# Patient Record
Sex: Male | Born: 1995 | Race: White | Hispanic: No | Marital: Single | Smoking: Current every day smoker
Health system: Southern US, Community
[De-identification: ages and names within clinical notes are randomized; demographics above are authoritative.]

## PROBLEM LIST (undated history)

## (undated) DIAGNOSIS — F419 Anxiety disorder, unspecified: Secondary | ICD-10-CM

## (undated) DIAGNOSIS — F319 Bipolar disorder, unspecified: Secondary | ICD-10-CM

## (undated) HISTORY — DX: Bipolar disorder, unspecified: F31.9

## (undated) HISTORY — DX: Anxiety disorder, unspecified: F41.9

---
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
## 2014-01-22 ENCOUNTER — Ambulatory Visit: Payer: Self-pay | Admitting: Orthopedic Surgery

## 2014-08-31 ENCOUNTER — Emergency Department: Payer: Self-pay | Admitting: Emergency Medicine

## 2014-08-31 LAB — COMPREHENSIVE METABOLIC PANEL
ALBUMIN: 4.2 g/dL (ref 3.8–5.6)
ALK PHOS: 71 U/L
ALT: 31 U/L
ANION GAP: 4 — AB (ref 7–16)
BILIRUBIN TOTAL: 0.6 mg/dL (ref 0.2–1.0)
BUN: 11 mg/dL (ref 7–18)
CALCIUM: 9.4 mg/dL (ref 9.0–10.7)
CHLORIDE: 105 mmol/L (ref 98–107)
CO2: 30 mmol/L (ref 21–32)
Creatinine: 0.88 mg/dL (ref 0.60–1.30)
EGFR (Non-African Amer.): >60
Glucose: 95 mg/dL (ref 65–99)
Osmolality: 277 (ref 275–301)
Potassium: 4.4 mmol/L (ref 3.5–5.1)
SGOT(AST): 14 U/L (ref 10–41)
SODIUM: 139 mmol/L (ref 136–145)
Total Protein: 7.3 g/dL (ref 6.4–8.6)

## 2014-08-31 LAB — URINALYSIS, COMPLETE
BACTERIA: NONE SEEN
BLOOD: NEGATIVE
Bilirubin,UR: NEGATIVE
GLUCOSE, UR: NEGATIVE mg/dL (ref 0–75)
Ketone: NEGATIVE
LEUKOCYTE ESTERASE: NEGATIVE
NITRITE: NEGATIVE
Ph: 6 (ref 4.5–8.0)
Protein: NEGATIVE
RBC,UR: <1 /HPF (ref 0–5)
Specific Gravity: 1.019 (ref 1.003–1.030)
Squamous Epithelial: <1
WBC UR: 1 /HPF (ref 0–5)

## 2014-08-31 LAB — CBC WITH DIFFERENTIAL/PLATELET
BASOS ABS: 0 10*3/uL (ref 0.0–0.1)
BASOS PCT: 0.4 %
EOS PCT: 9 %
Eosinophil #: 0.6 10*3/uL (ref 0.0–0.7)
HCT: 48 % (ref 40.0–52.0)
HGB: 16 g/dL (ref 13.0–18.0)
LYMPHS ABS: 1.7 10*3/uL (ref 1.0–3.6)
LYMPHS PCT: 25.8 %
MCH: 29.5 pg (ref 26.0–34.0)
MCHC: 33.4 g/dL (ref 32.0–36.0)
MCV: 88 fL (ref 80–100)
MONO ABS: 0.4 x10 3/mm (ref 0.2–1.0)
Monocyte %: 5.7 %
Neutrophil #: 3.8 10*3/uL (ref 1.4–6.5)
Neutrophil %: 59.1 %
Platelet: 177 10*3/uL (ref 150–440)
RBC: 5.43 10*6/uL (ref 4.40–5.90)
RDW: 13.6 % (ref 11.5–14.5)
WBC: 6.5 10*3/uL (ref 3.8–10.6)

## 2014-08-31 LAB — LIPASE, BLOOD: LIPASE: 108 U/L (ref 73–393)

## 2014-09-29 LAB — BASIC METABOLIC PANEL
BUN: 10 mg/dL (ref 4–21)
CREATININE: 0.8 mg/dL (ref 0.6–1.3)
Glucose: 79 mg/dL
POTASSIUM: 4.2 mmol/L (ref 3.4–5.3)
Sodium: 142 mmol/L (ref 137–147)

## 2014-09-29 LAB — CBC AND DIFFERENTIAL
HCT: 46 % (ref 41–53)
HEMOGLOBIN: 15.8 g/dL (ref 13.5–17.5)
NEUTROS ABS: 59 /uL
PLATELETS: 238 10*3/uL (ref 150–399)
WBC: 8 10^3/mL

## 2014-09-29 LAB — TSH: TSH: 0.7 u[IU]/mL (ref 0.41–5.90)

## 2014-09-29 LAB — HEPATIC FUNCTION PANEL
ALT: 12 U/L (ref 10–40)
AST: 13 U/L — AB (ref 14–40)
Alkaline Phosphatase: 65 U/L (ref 25–125)
Bilirubin, Total: 0.5 mg/dL

## 2015-03-22 ENCOUNTER — Encounter: Payer: Self-pay | Admitting: Family Medicine

## 2015-03-22 ENCOUNTER — Ambulatory Visit (INDEPENDENT_AMBULATORY_CARE_PROVIDER_SITE_OTHER): Payer: PRIVATE HEALTH INSURANCE | Admitting: Family Medicine

## 2015-03-22 VITALS — BP 110/72 | HR 74 | Temp 97.6°F | Resp 16 | Ht 72.5 in | Wt 165.8 lb

## 2015-03-22 DIAGNOSIS — F410 Panic disorder [episodic paroxysmal anxiety] without agoraphobia: Secondary | ICD-10-CM | POA: Diagnosis not present

## 2015-03-22 DIAGNOSIS — R634 Abnormal weight loss: Secondary | ICD-10-CM

## 2015-03-22 DIAGNOSIS — R197 Diarrhea, unspecified: Secondary | ICD-10-CM

## 2015-03-22 DIAGNOSIS — F5089 Other specified eating disorder: Secondary | ICD-10-CM

## 2015-03-22 DIAGNOSIS — F508 Other eating disorders: Secondary | ICD-10-CM | POA: Diagnosis not present

## 2015-03-22 DIAGNOSIS — F329 Major depressive disorder, single episode, unspecified: Secondary | ICD-10-CM | POA: Insufficient documentation

## 2015-03-22 DIAGNOSIS — F419 Anxiety disorder, unspecified: Secondary | ICD-10-CM | POA: Insufficient documentation

## 2015-03-22 DIAGNOSIS — G4700 Insomnia, unspecified: Secondary | ICD-10-CM | POA: Insufficient documentation

## 2015-03-22 DIAGNOSIS — F3162 Bipolar disorder, current episode mixed, moderate: Secondary | ICD-10-CM | POA: Diagnosis not present

## 2015-03-22 DIAGNOSIS — F32A Depression, unspecified: Secondary | ICD-10-CM | POA: Insufficient documentation

## 2015-03-22 DIAGNOSIS — F3181 Bipolar II disorder: Secondary | ICD-10-CM | POA: Insufficient documentation

## 2015-03-22 MED ORDER — LORAZEPAM 0.5 MG PO TABS: 0.5000 mg | ORAL_TABLET | Freq: Three times a day (TID) | ORAL | Status: DC

## 2015-03-22 MED ORDER — ONDANSETRON HCL 8 MG PO TABS: 8.0000 mg | ORAL_TABLET | Freq: Three times a day (TID) | ORAL | Status: DC | PRN

## 2015-03-22 NOTE — Progress Notes (Signed)
Subjective:    Patient ID: Lucas Cross, male    DOB: [birth year], 19 y.o.   MRN: [id number]  HPI Anxiety progressing recently (panic/anxiety with physical manifestations of nausea, vomiting, stomach cramps, diarrhea, not eating well and losing sleep). Sleeping 4-5 hours a night and girlfriend has notice extra irritability and angry outbursts as in the past). When anxiety flares, he will use marijuana to try to control symptoms.  Has a history of Bipolar disorder with paranoia treated by Dr. Maryruth Bun in the past. Has been off medication recently. Last OV on 09-29-14, he was treated by Dr. Sullivan Lone with Lorazepam, Abilify and Zofran. Requesting a refill of medications for panic and nausea.  History  Substance Use Topics  . Smoking status: Former Games developer  . Smokeless tobacco: Not on file  . Alcohol Use: No    History reviewed. No pertinent past medical history. Past Surgical History  Procedure Laterality Date  . Achilles tendon repair  2012   Family History  Problem Relation Age of Onset  . Alcohol abuse Mother   . Depression Mother   . Bipolar disorder Mother   . Depression Sister   . Bipolar disorder Sister    No current outpatient prescriptions on file prior to visit.   No current facility-administered medications on file prior to visit.   Allergies  Allergen Reactions  . Ibuprofen Nausea Only    Review of Systems  Constitutional: Positive for appetite change, fatigue and unexpected weight change.  Eyes: Negative.   Respiratory: Positive for chest tightness and shortness of breath.   Cardiovascular: Positive for palpitations. Negative for chest pain.  Gastrointestinal: Positive for nausea, vomiting, abdominal pain and diarrhea.  Endocrine: Positive for cold intolerance, heat intolerance and polydipsia.  Genitourinary: Negative.   Musculoskeletal:       Back and knuckles achy from past boxing for 5 years (age [age]).  Skin: Negative.   Neurological: Positive for  light-headedness and headaches. Negative for dizziness, tremors, seizures and syncope.  Psychiatric/Behavioral: Positive for agitation. The patient is nervous/anxious.        History of Bipolar Disorder. Having panic, anger and irritability with anorexia and weight loss. Getting daily marijuana (1-2x/day). Denies other illicit drug use or suicidal ideation.       Objective:   Physical Exam  Constitutional: He is oriented to person, place, and time. Vital signs are normal. He has a sickly appearance.  HENT:  Head: Normocephalic and atraumatic.  Right Ear: External ear normal.  Left Ear: External ear normal.  Nose: Nose normal.  Mouth/Throat: Oropharynx is clear and moist.  Eyes: Conjunctivae and EOM are normal. Pupils are equal, round, and reactive to light.  Neck: Normal range of motion. Neck supple.  Cardiovascular: Normal rate, regular rhythm and normal heart sounds.   Pulmonary/Chest: Effort normal and breath sounds normal.  Abdominal:  Generalized tenderness. BS wnl. No masses.  Musculoskeletal: Normal range of motion.  Neurological: He is oriented to person, place, and time.  Skin: Skin is warm. There is pallor.  Psychiatric: His speech is normal. His mood appears anxious. He is slowed. Thought content is not paranoid. He expresses no suicidal ideation. He expresses no suicidal plans. He is attentive.   BP 110/72 mmHg  Pulse 74  Temp(Src) 97.6 F (36.4 C) (Oral)  Resp 16  Ht 6' 0.5" (1.842 m)  Wt 165 lb 12.8 oz (75.206 kg)  BMI 22.17 kg/m2         Assessment & Plan:   1. Panic  anxiety syndrome Flare of panic and anxiety recently. Will get drug screen and blood tests to rule out illicit drug use and metabolic imbalances from poor eating habits. Will give Lorazepam to help control panic until lab reports are available and getting appointment with psychiatrist. - TSH - POCT Urine Drug Screen - LORazepam (ATIVAN) 0.5 MG tablet; Take 1 tablet (0.5 mg total) by mouth  every 8 (eight) hours.  Dispense: 30 tablet; Refill: 0  2. Diarrhea Recent onset with panic spikes. No recent travel out of the country. No bloody diarrhea and seems to be better controlled today. Will get routine labs and may use Imodium or Pepto-Bismol prn. - CBC with Differential - COMPLETE METABOLIC PANEL WITH GFR - HgB X5MA1c - POCT Urinalysis Dipstick  3. Weight loss Secondary to poor eating habits and GI upset from panic and Bipolar disorder. Encouraged to eat 3 balanced meals a day. Will get routine labs to rule out diabetes, thyroid disease, anemia or dehydration. - CBC with Differential - COMPLETE METABOLIC PANEL WITH GFR - TSH - HgB A1c - POCT Urinalysis Dipstick  4. Bipolar disorder, current episode mixed, moderate Been off medications and not had follow up with Dr. Maryruth BunKapur in several weeks (personal conflict with Dr. Maryruth BunKapur and unsure he could return with confidence). Pending lab reports, will schedule second opinion with another psychiatrist - denies suicidal ideation and agrees with plan. - POCT Urine Drug Screen  5. Psychogenic vomiting with nausea Secondary to panic/anxiety attacks. Will refill Zofran. States it helped a great deal in the past. Recheck routine labs. - ondansetron (ZOFRAN) 8 MG tablet; Take 1 tablet (8 mg total) by mouth every 8 (eight) hours as needed for nausea or vomiting.  Dispense: 20 tablet; Refill: 0

## 2015-04-21 ENCOUNTER — Encounter: Payer: Self-pay | Admitting: Family Medicine

## 2015-04-21 ENCOUNTER — Other Ambulatory Visit: Payer: Self-pay | Admitting: Family Medicine

## 2015-04-21 ENCOUNTER — Ambulatory Visit (INDEPENDENT_AMBULATORY_CARE_PROVIDER_SITE_OTHER): Payer: PRIVATE HEALTH INSURANCE | Admitting: Family Medicine

## 2015-04-21 VITALS — BP 104/66 | HR 68 | Temp 97.7°F | Resp 16 | Wt 166.2 lb

## 2015-04-21 DIAGNOSIS — R042 Hemoptysis: Secondary | ICD-10-CM

## 2015-04-21 DIAGNOSIS — F508 Other eating disorders: Secondary | ICD-10-CM

## 2015-04-21 DIAGNOSIS — F410 Panic disorder [episodic paroxysmal anxiety] without agoraphobia: Secondary | ICD-10-CM | POA: Diagnosis not present

## 2015-04-21 DIAGNOSIS — R1013 Epigastric pain: Secondary | ICD-10-CM | POA: Diagnosis not present

## 2015-04-21 DIAGNOSIS — R110 Nausea: Secondary | ICD-10-CM

## 2015-04-21 DIAGNOSIS — F5089 Other specified eating disorder: Secondary | ICD-10-CM

## 2015-04-21 LAB — POCT URINALYSIS DIPSTICK
Bilirubin, UA: NEGATIVE
Blood, UA: NEGATIVE
Glucose, UA: NEGATIVE
KETONES UA: NEGATIVE
Leukocytes, UA: NEGATIVE
NITRITE UA: NEGATIVE
PH UA: 7
Protein, UA: NEGATIVE
Spec Grav, UA: 1.015
UROBILINOGEN UA: 0.2

## 2015-04-21 MED ORDER — ONDANSETRON HCL 8 MG PO TABS: 8.0000 mg | ORAL_TABLET | Freq: Three times a day (TID) | ORAL | Status: DC | PRN

## 2015-04-21 MED ORDER — LORAZEPAM 0.5 MG PO TABS: 0.5000 mg | ORAL_TABLET | Freq: Three times a day (TID) | ORAL | Status: DC

## 2015-04-21 NOTE — Progress Notes (Signed)
Subjective:    Patient ID: Lucas Cross, male    DOB: November 11, 1995, 19 y.o.   MRN: [id number] Chief Complaint  Patient presents with  . Referral    to GI for stomach issues  . Medication Refill    Ativan and Zofran    HPI This 19 year old male anxiety fairly controlled with use of Ativan (don't like to take it - take it 2-3 times a week) and Zofran (once or twice a day with antacid twice a day) for nausea. States significant anxiety occurs twice a day and is helped by smoking marijuana. Feels anxiety occurring more in social settings and in crowds. Went to the beach last week, had 2 beers and "coughed up bright red blood" the next morning a couple times. Had some heartburn, nausea and stomach cramps. Still had marijuana 3-4 times a week and feels it helps settle stomach upset (nausea, diarrhea, stomach cramps).  Patient Active Problem List   Diagnosis Date Noted  . Anxiety 03/22/2015  . Bipolar 2 disorder, major depressive episode 03/22/2015  . Clinical depression 03/22/2015  . Cannot sleep 03/22/2015  . Weight loss 03/22/2015   History   Social History  . Marital Status: Single    Spouse Name: N/A  . Number of Children: N/A  . Years of Education: N/A   Occupational History  . Not on file.   Social History Main Topics  . Smoking status: Former Research scientist (life sciences)  . Smokeless tobacco: Not on file  . Alcohol Use: No  . Drug Use: Yes     Comment: uses marijuana once or twice a week  . Sexual Activity: Not on file   Other Topics Concern  . Not on file   Social History Narrative     Past Surgical History  Procedure Laterality Date  . Achilles tendon repair  2012   Family History  Problem Relation Age of Onset  . Alcohol abuse Mother   . Depression Mother   . Bipolar disorder Mother   . Depression Sister   . Bipolar disorder Sister    Current Outpatient Prescriptions on File Prior to Visit  Medication Sig Dispense Refill  . LORazepam (ATIVAN) 0.5 MG tablet Take 1  tablet (0.5 mg total) by mouth every 8 (eight) hours. 30 tablet 0  . ondansetron (ZOFRAN) 8 MG tablet Take 1 tablet (8 mg total) by mouth every 8 (eight) hours as needed for nausea or vomiting. 20 tablet 0   No current facility-administered medications on file prior to visit.   Allergies  Allergen Reactions  . Ibuprofen Nausea Only    Review of Systems  Constitutional: Positive for appetite change and unexpected weight change.       Ten pound weight loss since March 2016. Decreased appetite - one meal a day  HENT: Negative.   Eyes: Negative.   Respiratory: Positive for shortness of breath.        With anxiety/panic episodes.  Cardiovascular: Positive for palpitations.       With panic and anxiety.  Gastrointestinal: Positive for nausea, vomiting and diarrhea. Negative for blood in stool.       Recent heartburn  Genitourinary: Negative.   Musculoskeletal: Negative.   Hematological: Negative.   Psychiatric/Behavioral: Positive for agitation. Negative for suicidal ideas. The patient is nervous/anxious.        History of Bipolar Disorder with panic/anxiety.      BP 104/66 mmHg  Pulse 68  Temp(Src) 97.7 F (36.5 C) (Oral)  Resp 16  Wt 166 lb 3.2 oz (75.388 kg)  Objective:   Physical Exam  Constitutional: He appears well-developed and well-nourished.  HENT:  Head: Normocephalic and atraumatic.  Right Ear: External ear normal.  Left Ear: External ear normal.  Nose: Nose normal.  Mouth/Throat: Oropharynx is clear and moist.  Eyes: EOM are normal. Pupils are equal, round, and reactive to light.  Neck: Normal range of motion. Neck supple.  Cardiovascular: Normal rate, regular rhythm and normal heart sounds.   Pulmonary/Chest: Effort normal and breath sounds normal.  Abdominal: Soft. He exhibits no mass. There is tenderness. There is no guarding.  Generalized abdominal soreness.  Musculoskeletal: Normal range of motion.  Skin: Skin is warm and dry.  Psychiatric: Thought  content normal.  Some anxiousness with episodes of irritability.      Assessment & Plan:  1. Hemoptysis Onset last week. Denies nose bleeds and no significant coughing since his beach trip last week. States he has had some blood streaked sputum in the past with nasal congestion. Worried it may have been from reflux and dyspepsia this time. Will get labs to rule out significant blood loss. Given OC-Light kit to test stools for blood at home. - CBC with Differential/Platelet - Comprehensive metabolic panel  2. Dyspepsia Recent flare at the beach last week. Will check for H.pylori and may need GI referral if signs of blood loss or positive OC-Light. Should use Ranitidine 150 mg BID regualrly. - CBC with Differential/Platelet - H. pylori breath test  3. Nausea Chronic issue with some weight loss. Urinalysis normal today. Check CBC and CMP. May need GI referral. Suspect some relationship to Bipolar Disorder. - Comprehensive metabolic panel - POCT Urinalysis Dipstick  4. Panic anxiety syndrome Feels he is controlling the panic/anxiety with the Lorazepam and smoking marijuana. Recommend he stop smoking anything with spitting up blood and follow up with psychiatrist. Will refill Lorazepam today. - TSH - LORazepam (ATIVAN) 0.5 MG tablet; Take 1 tablet (0.5 mg total) by mouth every 8 (eight) hours.  Dispense: 30 tablet; Refill: 0  5. Psychogenic vomiting with nausea Did not go back to psychiatrist. Feels the Lorazepam and Ondansetron helps to control anxiety, irritability and nausea/vomiting symptoms. Encouraged to follow up with psychiatrist. - ondansetron (ZOFRAN) 8 MG tablet; Take 1 tablet (8 mg total) by mouth every 8 (eight) hours as needed for nausea or vomiting.  Dispense: 20 tablet; Refill: 0

## 2015-04-22 ENCOUNTER — Telehealth: Payer: Self-pay

## 2015-04-22 LAB — CBC WITH DIFFERENTIAL/PLATELET
BASOS ABS: 0 10*3/uL (ref 0.0–0.2)
Basos: 1 %
EOS (ABSOLUTE): 0.4 10*3/uL (ref 0.0–0.4)
EOS: 6 %
HEMATOCRIT: 43.2 % (ref 37.5–51.0)
HEMOGLOBIN: 14.8 g/dL (ref 12.6–17.7)
Immature Grans (Abs): 0 10*3/uL (ref 0.0–0.1)
Immature Granulocytes: 0 %
LYMPHS ABS: 2.4 10*3/uL (ref 0.7–3.1)
LYMPHS: 32 %
MCH: 29.4 pg (ref 26.6–33.0)
MCHC: 34.3 g/dL (ref 31.5–35.7)
MCV: 86 fL (ref 79–97)
MONOCYTES: 9 %
Monocytes Absolute: 0.6 10*3/uL (ref 0.1–0.9)
NEUTROS ABS: 4 10*3/uL (ref 1.4–7.0)
Neutrophils: 52 %
Platelets: 295 10*3/uL (ref 150–379)
RBC: 5.03 x10E6/uL (ref 4.14–5.80)
RDW: 14.1 % (ref 12.3–15.4)
WBC: 7.5 10*3/uL (ref 3.4–10.8)

## 2015-04-22 LAB — COMPREHENSIVE METABOLIC PANEL
A/G RATIO: 2 (ref 1.1–2.5)
ALT: 26 IU/L (ref 0–44)
AST: 22 IU/L (ref 0–40)
Albumin: 4.5 g/dL (ref 3.5–5.5)
Alkaline Phosphatase: 57 IU/L (ref 39–117)
BILIRUBIN TOTAL: 0.4 mg/dL (ref 0.0–1.2)
BUN/Creatinine Ratio: 29 — ABNORMAL HIGH (ref 8–19)
BUN: 18 mg/dL (ref 6–20)
CALCIUM: 10.3 mg/dL — AB (ref 8.7–10.2)
CO2: 22 mmol/L (ref 18–29)
CREATININE: 0.62 mg/dL — AB (ref 0.76–1.27)
Chloride: 102 mmol/L (ref 97–108)
GFR calc Af Amer: 166 mL/min/{1.73_m2} (ref >59)
GFR calc non Af Amer: 144 mL/min/{1.73_m2} (ref >59)
GLOBULIN, TOTAL: 2.3 g/dL (ref 1.5–4.5)
Glucose: 77 mg/dL (ref 65–99)
POTASSIUM: 5.1 mmol/L (ref 3.5–5.2)
SODIUM: 143 mmol/L (ref 134–144)
Total Protein: 6.8 g/dL (ref 6.0–8.5)

## 2015-04-22 LAB — H. PYLORI BREATH TEST: H. pylori UBiT: NEGATIVE

## 2015-04-22 LAB — TSH: TSH: 0.918 u[IU]/mL (ref 0.450–4.500)

## 2015-04-22 NOTE — Telephone Encounter (Signed)
Patient advised as directed below. Patient verbalized understanding and agrees with treatment plan. 

## 2015-04-22 NOTE — Telephone Encounter (Signed)
-----   Message from Tamsen Roersennis E Chrismon, [place] sent at 04/22/2015  3:33 PM EDT ----- So far, normal blood tests. Awaiting final urine and H.pylori test. Recommend he use Ranitidine 150 mg (OTC) BID to try to help stomach.

## 2015-04-23 LAB — C.O.C. TEST NOT ORDERED

## 2015-04-25 ENCOUNTER — Telehealth: Payer: Self-pay

## 2015-04-25 NOTE — Telephone Encounter (Signed)
-----   Message from Tamsen Roersennis E Chrismon, [place] sent at 04/22/2015  5:53 PM EDT ----- H.pylori breath test negative for the bacteria that causes stomach ulcers. Awaiting final lab results. Proceed with Ranitidine 150 mg BID for stomach symptoms.

## 2015-04-25 NOTE — Telephone Encounter (Signed)
Left a detailed message on patient's voicemail (consent in chart) advising him the test was normal, and to proceed with treatment plan as discussed on Friday.

## 2015-04-26 ENCOUNTER — Other Ambulatory Visit (INDEPENDENT_AMBULATORY_CARE_PROVIDER_SITE_OTHER): Payer: PRIVATE HEALTH INSURANCE | Admitting: Emergency Medicine

## 2015-04-26 DIAGNOSIS — R1013 Epigastric pain: Secondary | ICD-10-CM | POA: Diagnosis not present

## 2015-04-26 LAB — IFOBT (OCCULT BLOOD): IFOBT: NEGATIVE

## 2015-04-27 ENCOUNTER — Telehealth: Payer: Self-pay

## 2015-04-27 NOTE — Telephone Encounter (Signed)
-----   Message from Tamsen Roersennis E Chrismon, [place] sent at 04/26/2015  6:22 PM EDT ----- Stool test negative for blood in bowels. Proceed with acid medication and if no better in 5-6 days, will need referral to gastroenterologist.

## 2015-04-27 NOTE — Telephone Encounter (Signed)
Left patient a detailed message on cell voicemail advising him the test was negative, and to call back if no better in 5-6 days.

## 2015-04-30 LAB — DRUG-BUND PANEL (9)
AMPHETAMINE: NEGATIVE ng/mL
Barbiturates: NEGATIVE ng/mL
Benzodiazepines: NEGATIVE ng/mL
Cocaine (Metab.): NEGATIVE ng/mL
Methadone Screen, Urine: NEGATIVE ng/mL
Opiates: NEGATIVE ng/mL
PHENCYCLIDINE: NEGATIVE ng/mL
PROPOXYPHENE: NEGATIVE ng/mL

## 2015-04-30 LAB — PANEL 799049
CARBOXY THC GC/MS CONF: 435 ng/mL
Cannabinoid GC/MS, Ur: POSITIVE — AB

## 2015-05-06 ENCOUNTER — Ambulatory Visit: Payer: PRIVATE HEALTH INSURANCE | Admitting: Psychiatry

## 2015-06-30 ENCOUNTER — Encounter: Payer: Self-pay | Admitting: Family Medicine

## 2015-06-30 ENCOUNTER — Ambulatory Visit (INDEPENDENT_AMBULATORY_CARE_PROVIDER_SITE_OTHER): Payer: PRIVATE HEALTH INSURANCE | Admitting: Family Medicine

## 2015-06-30 VITALS — BP 98/82 | HR 92 | Temp 98.2°F | Resp 18 | Wt 174.2 lb

## 2015-06-30 DIAGNOSIS — F410 Panic disorder [episodic paroxysmal anxiety] without agoraphobia: Secondary | ICD-10-CM

## 2015-06-30 DIAGNOSIS — F508 Other eating disorders: Secondary | ICD-10-CM | POA: Diagnosis not present

## 2015-06-30 DIAGNOSIS — F3181 Bipolar II disorder: Secondary | ICD-10-CM

## 2015-06-30 DIAGNOSIS — F5089 Other specified eating disorder: Secondary | ICD-10-CM

## 2015-06-30 MED ORDER — LORAZEPAM 0.5 MG PO TABS: 0.5000 mg | ORAL_TABLET | Freq: Three times a day (TID) | ORAL | Status: DC

## 2015-06-30 MED ORDER — ONDANSETRON HCL 8 MG PO TABS: 8.0000 mg | ORAL_TABLET | Freq: Three times a day (TID) | ORAL | Status: DC | PRN

## 2015-06-30 NOTE — Progress Notes (Signed)
Patient ID: Lucas Cross, male   DOB: [birth year], 19 y.o.   MRN: [id number]   Chief Complaint  Patient presents with  . Medication Refill    Ativan 0.5 mg tablets  . Referral    to psychiatrist     Subjective:  HPI This 19 year old male with history of Bipolar disorder with panic/anxiety syndrome with physical manifestations of nausea and losing sleep. History of treatment by Dr. Maryruth Bun (psychiatrist) for Bipolar Disorder with Paranoia. Symptoms have been increasing anxiety and feeling overwhelmed since parents divorced 2 - 2 1/2 weeks ago. 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  Prior to Admission medications   Medication Sig Start Date End Date Taking? Authorizing Provider  LORazepam (ATIVAN) 0.5 MG tablet Take 1 tablet (0.5 mg total) by mouth every 8 (eight) hours. 04/21/15  Yes Dennis E Chrismon, PA  ondansetron (ZOFRAN) 8 MG tablet Take 1 tablet (8 mg total) by mouth every 8 (eight) hours as needed for nausea or vomiting. 04/21/15  Yes Tamsen Roers, PA    Patient Active Problem List   Diagnosis Date Noted  . Anxiety 03/22/2015  . Bipolar 2 disorder, major depressive episode 03/22/2015  . Clinical depression 03/22/2015  . Cannot sleep 03/22/2015  . Weight loss 03/22/2015   History reviewed. No pertinent past medical history.   Family History  Problem Relation Age of Onset  . Alcohol abuse Mother   . Depression Mother   . Bipolar disorder Mother   . Depression Sister   . Bipolar disorder Sister    Past Surgical History  Procedure Laterality Date  . Achilles tendon repair  2012   Social History   Social History  . Marital Status: Single    Spouse Name: N/A  . Number of Children: N/A  . Years of Education: N/A   Occupational History  . Not on file.   Social History Main Topics  . Smoking status: Former Games developer  . Smokeless tobacco: Not on file  . Alcohol Use: No  . Drug Use: Yes     Comment:  uses marijuana once or twice a week  . Sexual Activity: Not on file   Other Topics Concern  . Not on file   Social History Narrative   Allergies  Allergen Reactions  . Ibuprofen Nausea Only   Review of Systems  Constitutional: Negative.   HENT: Negative.   Eyes: Negative.   Respiratory: Negative.   Cardiovascular: Negative.   Gastrointestinal: Negative.   Genitourinary: Negative.   Musculoskeletal: Negative.   Skin: Negative.   Neurological: Negative.   Endo/Heme/Allergies: Negative.   Psychiatric/Behavioral: The patient is nervous/anxious.     There is no immunization history on file for this patient. Objective:  BP 98/82 mmHg  Pulse 92  Temp(Src) 98.2 F (36.8 C) (Oral)  Resp 18  Wt 174 lb 3.2 oz (79.017 kg)  SpO2 98% Wt Readings from Last 3 Encounters:  06/30/15 174 lb 3.2 oz (79.017 kg) (75 %*, Z = 0.66)  04/21/15 166 lb 3.2 oz (75.388 kg) (66 %*, Z = 0.42)  03/22/15 165 lb 12.8 oz (75.206 kg) (66 %*, Z = 0.41)   * Growth percentiles are based on CDC 2-20 Years data.   Physical Exam  Constitutional: He is oriented to person, place, and time and well-developed, well-nourished, and in no distress.  HENT:  Head: Normocephalic and atraumatic.  Mouth/Throat: Oropharynx is clear and moist.  Eyes: Conjunctivae and EOM are normal.  Neck: Neck supple.  Cardiovascular: Normal rate and regular rhythm.   Pulmonary/Chest: Effort normal and breath sounds normal.  Abdominal: Bowel sounds are normal.  Musculoskeletal: Normal range of motion.  Neurological: He is oriented to person, place, and time.  Skin: No rash noted.  Psychiatric: Memory and judgment normal. His mood appears anxious. He exhibits a depressed mood. He expresses no suicidal plans. He has a flat affect.  Some anxiety/panic and paranoia in the past.     Lab Results  Component Value Date   WBC 7.5 04/21/2015   HGB 15.8 09/29/2014   HCT 43.2 04/21/2015   PLT 238 09/29/2014   GLUCOSE 77 04/21/2015    TSH 0.918 04/21/2015    CMP     Component Value Date/Time   NA 143 04/21/2015 0958   NA 139 08/31/2014 1050   K 5.1 04/21/2015 0958   K 4.4 08/31/2014 1050   CL 102 04/21/2015 0958   CL 105 08/31/2014 1050   CO2 22 04/21/2015 0958   CO2 30 08/31/2014 1050   GLUCOSE 77 04/21/2015 0958   GLUCOSE 95 08/31/2014 1050   BUN 18 04/21/2015 0958   BUN 11 08/31/2014 1050   CREATININE 0.62* 04/21/2015 0958   CREATININE 0.8 09/29/2014   CREATININE 0.88 08/31/2014 1050   CALCIUM 10.3* 04/21/2015 0958   CALCIUM 9.4 08/31/2014 1050   PROT 6.8 04/21/2015 0958   PROT 7.3 08/31/2014 1050   ALBUMIN 4.2 08/31/2014 1050   AST 22 04/21/2015 0958   AST 14 08/31/2014 1050   ALT 26 04/21/2015 0958   ALT 31 08/31/2014 1050   ALKPHOS 57 04/21/2015 0958   ALKPHOS 71 08/31/2014 1050   BILITOT 0.4 04/21/2015 0958   BILITOT 0.6 08/31/2014 1050   GFRNONAA 144 04/21/2015 0958   GFRNONAA >60 08/31/2014 1050   GFRAA 166 04/21/2015 0958   GFRAA >60 08/31/2014 1050   Assessment and Plan :  1. Panic anxiety syndrome Ran out of Ativan (used 30 in the past 2 months) and having an increase in panic/paranoia since parents divorced 2-3 weeks ago. Requesting referral to psychiatry ("did not have a good relationship with Dr. Maryruth Bun"). Will schedule referral. - Ambulatory referral to Psychiatry - LORazepam (ATIVAN) 0.5 MG tablet; Take 1 tablet (0.5 mg total) by mouth every 8 (eight) hours.  Dispense: 30 tablet; Refill: 0  2. Bipolar 2 disorder, major depressive episode Not sleeping well and feeling down. No suicidal ideation. No longer taking the Abilify. Intermittent use of the Ativan for panic attacks. Still working 35 hours a week and going to school each morning. Request psychiatry referral. - Ambulatory referral to Psychiatry  3. Psychogenic vomiting with nausea Having nausea 2-3 times a week. Finds the Zofran controls this (used 30 in the past 2 months). Will refill and schedule psychiatry referral. -  Ambulatory referral to Psychiatry - ondansetron (ZOFRAN) 8 MG tablet; Take 1 tablet (8 mg total) by mouth every 8 (eight) hours as needed for nausea or vomiting.  Dispense: 20 tablet; Refill: 0  Dortha Kern [facility] 06/30/2015 10:58 AM

## 2015-07-08 ENCOUNTER — Encounter: Payer: Self-pay | Admitting: Family Medicine

## 2015-07-08 ENCOUNTER — Ambulatory Visit (INDEPENDENT_AMBULATORY_CARE_PROVIDER_SITE_OTHER): Payer: PRIVATE HEALTH INSURANCE | Admitting: Family Medicine

## 2015-07-08 VITALS — BP 112/60 | HR 76 | Temp 98.3°F | Resp 16 | Wt 177.0 lb

## 2015-07-08 DIAGNOSIS — J069 Acute upper respiratory infection, unspecified: Secondary | ICD-10-CM | POA: Diagnosis not present

## 2015-07-08 NOTE — Patient Instructions (Addendum)
Discussed use of Mucinex D for congestion, Delsym for cough, and Claritin for postnasal drainage. Call in the next few days if sinuses not improving.

## 2015-07-08 NOTE — Progress Notes (Signed)
Subjective:     Patient ID: Lucas Cross, male   DOB: 12/14/1995, 19 y.o.   MRN: [id number]  HPI  Chief Complaint  Patient presents with  . Cough    Patient comes in office today with concerns of cold like symptoms for the past 3+ days. Patient reports productive cough especially in the evening, congestion, headache, runny nose and sore throat. Patient states he has tried otc Day Quil with no relief  Does not like Nyquil as it gives him nightmares.Prefers not to use rx cough syrups.   Review of Systems  Constitutional: Negative for fever and chills.       No body aches  Psychiatric/Behavioral:       States he has psychiatric appointment pending 10/20.       Objective:   Physical Exam  Constitutional: He appears well-developed and well-nourished. No distress.  Ears: T.M's intact without inflammation Sinuses: non-tender Throat: no tonsillar enlargement or exudate Neck: no cervical adenopathy but has mild tenderness Lungs: clear     Assessment:    1. Upper respiratory infection    Plan:    Discussed use of Mucinex D for congestion, Delsym for cough, and Claritin for postnasal drainage

## 2015-08-02 ENCOUNTER — Other Ambulatory Visit: Payer: Self-pay | Admitting: Family Medicine

## 2015-08-02 NOTE — Telephone Encounter (Signed)
Patient advised as directed below. Patient verbalized understanding. Patient states he has a appointment with the psychiatrist on 08/04/2015.

## 2015-08-02 NOTE — Telephone Encounter (Signed)
Advise patient we need a report from his psychiatrist referral to refill medications.

## 2015-08-02 NOTE — Telephone Encounter (Signed)
Pt contacted office for refill request on the following medications: ondansetron (ZOFRAN) 8 MG tablet & LORazepam (ATIVAN) 0.5 MG tablet to [facility]. Thanks TNP

## 2015-08-04 ENCOUNTER — Ambulatory Visit (INDEPENDENT_AMBULATORY_CARE_PROVIDER_SITE_OTHER): Payer: PRIVATE HEALTH INSURANCE | Admitting: Psychiatry

## 2015-08-04 ENCOUNTER — Encounter: Payer: Self-pay | Admitting: Psychiatry

## 2015-08-04 VITALS — BP 108/60 | HR 91 | Temp 99.0°F | Ht 72.0 in | Wt 173.8 lb

## 2015-08-04 DIAGNOSIS — F3161 Bipolar disorder, current episode mixed, mild: Secondary | ICD-10-CM

## 2015-08-04 DIAGNOSIS — F4001 Agoraphobia with panic disorder: Secondary | ICD-10-CM

## 2015-08-04 MED ORDER — ESCITALOPRAM OXALATE 10 MG PO TABS: 10.0000 mg | ORAL_TABLET | Freq: Every day | ORAL | Status: DC

## 2015-08-04 MED ORDER — MIRTAZAPINE 7.5 MG PO TABS: 7.5000 mg | ORAL_TABLET | Freq: Every day | ORAL | Status: DC

## 2015-08-04 MED ORDER — HYDROXYZINE PAMOATE 25 MG PO CAPS: 25.0000 mg | ORAL_CAPSULE | Freq: Two times a day (BID) | ORAL | Status: DC | PRN

## 2015-08-04 NOTE — Progress Notes (Signed)
Psychiatric Initial Adult Assessment   Patient Identification: Lucas GinsbergChristopher A Cross MRN:  [id number] Date of Evaluation:  08/04/2015 Ref[facility] Source[facility] Chief Complaint:   Chief Complaint    Establish Care; Anxiety; Depression; Panic Attack; Insomnia     Visit Diagnosis:    ICD-9-CM ICD-10-CM   1. 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 Bipolar disorder, current episode mixed, mild (HCC) 296.61 F31.61    Diagnosis:   Patient Active Problem List   Diagnosis Date Noted  . Anxiety [F41.9] 03/22/2015  . Bipolar 2 disorder, major depressive episode (HCC) [F31.81] 03/22/2015  . Clinical depression [F32.9] 03/22/2015  . Cannot sleep [G47.00] 03/22/2015  . Weight loss [R63.4] 03/22/2015   History of Present Illness:    Pt is a 19 yo male [facility]. 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 He is was in [place] at that time. He reported that [place] in the middle of his senior year in high school as his father was in the [employer]. Patient stated that he saw Dr. Maryruth BunKapur  one time and she prescribed him  Abilify but it made him too sedated and he stopped the medications. He continues to have anxiety symptoms and was prescribed lorazepam and Zofran by his primary care physician. He takes lorazepam on a when necessary basis. He is unable to sleep due to his anxiety and he stated that he has been controlling his symptoms by using marijuana on a daily basis. He smokes twice daily 1 in the morning and another at night. He stated that he is poor appetite and has been losing weight. He will not eat for 1-2 days in a row. Patient reported that he wants to complete degree in  criminal justice and is studying at [employer]. He wants to start taking medications so he can stop the use of marijuana at this time.   He appeared motivated during the  interview. He reported that he does not sleep well as he comes back from work around 11:30 to 12 at midnight. He reported that his appetite is very poor and he is interested in taking medications are the same. He stated that his mother asked him to come with an open mind so he can be medication compliant. He currently denied having any suicidal ideations or plans. He denied having any perceptual disturbances. He reported that he has a good relationship with his girlfriend. She is also in [employer]. He works at [employer] at this time.   Elements:  Location:  panic attacks. Severity:  severe. Duration:  daily. Associated Signs/Symptoms: Depression Symptoms:  depressed mood, insomnia, fatigue, feelings of worthlessness/guilt, hopelessness, anxiety, panic attacks, loss of energy/fatigue, weight loss, decreased appetite, (Hypo) Manic Symptoms:  none Anxiety Symptoms:  Agoraphobia, Excessive Worry, Psychotic Symptoms:  none PTSD Symptoms: Negative NA  Past Medical History:  Past Medical History  Diagnosis Date  . Bipolar disorder (HCC)   . Anxiety     Past Surgical History  Procedure Laterality Date  . Achilles tendon repair  2012   Family History:  Family History  Problem Relation Age of Onset  . Alcohol abuse Mother   . Depression Mother   . Bipolar disorder Mother   . Hypertension Mother   . Depression Sister   . Bipolar disorder Sister   . Obesity Father    Social History:  Social History   Social History  . Marital Status: Single    Spouse Name: N/A  . Number of Children: N/A  . Years of Education: N/A   Social History Main Topics  . Smoking status: Current Every Day Smoker    Types: Cigarettes    Start date: 08/03/2013  . Smokeless tobacco: Never Used  . Alcohol Use: No  . Drug Use: Yes    Special: Marijuana     Comment: last used 08-03-15, use daily  . Sexual Activity: Yes    Birth Control/ Protection: Condom   Other Topics Concern  . None   Social  History Narrative   Additional Social History:  Lives with parents. Parents recently divorced. Has a sister.   Musculoskeletal: Strength & Muscle Tone: within normal limits Gait & Station: normal Patient leans: N/A  Psychiatric Specialty Exam: HPI  ROS  Blood pressure 108/60, pulse 91, temperature 99 F (37.2 C), temperature source Tympanic, height 6' (1.829 m), weight 173 lb 12.8 oz (78.835 kg), SpO2 98 %.Body mass index is 23.57 kg/(m^2).  General Appearance: Casual  Eye Contact:  Fair  Speech:  Clear and Coherent and Normal Rate  Volume:  Normal  Mood:  Anxious and Depressed  Affect:  Congruent  Thought Process:  Coherent and Goal Directed  Orientation:  Full (Time, Place, and Person)  Thought Content:  WDL  Suicidal Thoughts:  No  Homicidal Thoughts:  No  Memory:  Immediate;   Fair  Judgement:  Fair  Insight:  Fair  Psychomotor Activity:  Normal  Concentration:  Fair  Recall:  [employer] of Knowledge:Fair  Language: Fair  Akathisia:  No  Handed:  Right  AIMS (if indicated):    Assets:  Communication Skills Desire for Improvement Physical Health Social Support Transportation  ADL's:  Intact  Cognition: WNL  Sleep:     Is the patient at risk to self?  No. Has the patient been a risk to self in the past 6 months?  No. Has the patient been a risk to self within the distant past?  No. Is the patient a risk to others?  No. Has the patient been a risk to others in the past 6 months?  No. Has the patient been a risk to others within the distant past?  No.  Allergies:   Allergies  Allergen Reactions  . Ibuprofen Nausea Only   Current Medications: Current Outpatient Prescriptions  Medication Sig Dispense Refill  . LORazepam (ATIVAN) 0.5 MG tablet Take 1 tablet (0.5 mg total) by mouth every 8 (eight) hours. 30 tablet 0  . ondansetron (ZOFRAN) 8 MG tablet Take 1 tablet (8 mg total) by mouth every 8 (eight) hours as needed for nausea or vomiting. 20 tablet 0    No current facility-administered medications for this visit.    Previous Psychotropic Medications: Lorazepam  Abilify- Sleepy and tired.  No h/o suicide attempt.  No h/o psychiatric illness  FAMILY HISTORY Alcohol- Mother Bipolar- Mother, sister, grandmother OCD- sister, mother, grandmother.     Substance Abuse History in the last 12 months:  Yes.    Uses MJ x 2 years. Denied using alcohol or other illicit drugs.   Consequences of Substance Abuse: Negative NA  Medical Decision Making:  Review of Psycho-Social Stressors (1) and Established Problem, Worsening (2)  Treatment Plan Summary: Medication management   Anxiety Patient will be started on Lexapro 10 mg by mouth daily. He will also be prescribed Vistaril 25 mg by mouth twice  a day when necessary for his anxiety symptoms.  Depression He will also be prescribed Remeron 7.5 mg to help with his depressive symptoms as well as decreased appetite. He agreed with the plan.  Follow-up Patient will follow up in 1 month or earlier depending on his symptoms   More than 50% of the time spent in psychoeducation, counseling and coordination of care.   Time spent with the patient 1 hour     This note was generated in part or whole with voice recognition software. Voice regonition is usually quite accurate but there are transcription errors that can and very often do occur. I apologize for any typographical errors that were not detected and corrected.     Brandy Hale, M.D 10/20/20163:07 PM

## 2015-09-01 ENCOUNTER — Encounter: Payer: Self-pay | Admitting: Psychiatry

## 2015-09-01 ENCOUNTER — Ambulatory Visit (INDEPENDENT_AMBULATORY_CARE_PROVIDER_SITE_OTHER): Payer: PRIVATE HEALTH INSURANCE | Admitting: Psychiatry

## 2015-09-01 VITALS — BP 122/60 | HR 88 | Temp 98.2°F | Ht 72.0 in | Wt 181.2 lb

## 2015-09-01 DIAGNOSIS — F4001 Agoraphobia with panic disorder: Secondary | ICD-10-CM

## 2015-09-01 MED ORDER — MIRTAZAPINE 15 MG PO TABS: 15.0000 mg | ORAL_TABLET | Freq: Every day | ORAL | Status: DC

## 2015-09-01 MED ORDER — HYDROXYZINE PAMOATE 25 MG PO CAPS: 25.0000 mg | ORAL_CAPSULE | Freq: Two times a day (BID) | ORAL | Status: DC | PRN

## 2015-09-01 MED ORDER — ESCITALOPRAM OXALATE 20 MG PO TABS: 20.0000 mg | ORAL_TABLET | Freq: Every day | ORAL | Status: DC

## 2015-09-01 NOTE — Progress Notes (Signed)
Psychiatric MD/NP Follow up Note   Patient Identification: Lucas Cross MRN:  [id number] Date of Evaluation:  09/01/2015 [facility] Chief Complaint:   Chief Complaint    Follow-up; Medication Refill     Visit Diagnosis:    ICD-9-CM ICD-10-CM   1. Panic disorder with agoraphobia 300.21 F40.01    Diagnosis:   Patient Active Problem List   Diagnosis Date Noted  . Anxiety [F41.9] 03/22/2015  . Bipolar 2 disorder, major depressive episode (HCC) [F31.81] 03/22/2015  . Clinical depression [F32.9] 03/22/2015  . Cannot sleep [G47.00] 03/22/2015  . Weight loss [R63.4] 03/22/2015   History of Present Illness:    Pt is a 19 yo male  who presented for the follow-up. He reported that he continues to have anxiety and was shaking his leg during the interview. He reported that he has started taking his medications as prescribed. He reported that he has been taking Lexapro in the morning and it has been at bedtime. He sleeping well with the help of mirtazapine. He reported that he takes hydroxyzine on a when necessary basis for his anxiety symptoms. He reported that he has some worsening of his anxiety and his grandmother and girlfriend were able to help him and support him during the time. He stated that he is trying to exercise more and his trying to relax himself. He was focused on his anxiety symptoms and appeared calm during the interview. He brought his bottles of medications with him during the interview. He sleeping well with the help of Remeron.   Patient stated that he is cutting down on his use of marijuana as well as he is smoking less now. He stated that he feels that Remeron is helping with his appetite and he is eating better and is trying to eat healthy now.    Elements:  Location:  panic attacks. Severity:  severe. Duration:  daily. Associated Signs/Symptoms: Depression Symptoms:  depressed mood, insomnia, fatigue, feelings of  worthlessness/guilt, hopelessness, anxiety, panic attacks, loss of energy/fatigue, weight loss, decreased appetite, (Hypo) Manic Symptoms:  none Anxiety Symptoms:  Agoraphobia, Excessive Worry, Psychotic Symptoms:  none PTSD Symptoms: Negative NA  Past Medical History:  Past Medical History  Diagnosis Date  . Bipolar disorder (HCC)   . Anxiety     Past Surgical History  Procedure Laterality Date  . Achilles tendon repair  2012   Family History:  Family History  Problem Relation Age of Onset  . Alcohol abuse Mother   . Depression Mother   . Bipolar disorder Mother   . Hypertension Mother   . Depression Sister   . Bipolar disorder Sister   . Obesity Father    Social History:   Social History   Social History  . Marital Status: Single    Spouse Name: N/A  . Number of Children: N/A  . Years of Education: N/A   Social History Main Topics  . Smoking status: Current Every Day Smoker    Types: E-cigarettes    Start date: 08/03/2013  . Smokeless tobacco: Never Used  . Alcohol Use: No  . Drug Use: Yes    Special: Marijuana     Comment: last used 08-30-15  . Sexual Activity: Yes    Birth Control/ Protection: Condom   Other Topics Concern  . None   Social History Narrative   Additional Social History:  Lives with parents. Parents recently divorced. Has a sister.   Musculoskeletal: Strength & Muscle Tone: within normal limits Gait &  Station: normal Patient leans: N/A  Psychiatric Specialty Exam: HPI   ROS   Blood pressure 122/60, pulse 88, temperature 98.2 F (36.8 C), temperature source Tympanic, height 6' (1.829 m), weight 181 lb 3.2 oz (82.192 kg), SpO2 96 %.Body mass index is 24.57 kg/(m^2).  General Appearance: Casual  Eye Contact:  Fair  Speech:  Clear and Coherent and Normal Rate  Volume:  Normal  Mood:  Anxious and Depressed  Affect:  Congruent  Thought Process:  Coherent and Goal Directed  Orientation:  Full (Time, Place, and Person)   Thought Content:  WDL  Suicidal Thoughts:  No  Homicidal Thoughts:  No  Memory:  Immediate;   Fair  Judgement:  Fair  Insight:  Fair  Psychomotor Activity:  Normal  Concentration:  Fair  Recall:  [employer] of Knowledge:Fair  Language: Fair  Akathisia:  No  Handed:  Right  AIMS (if indicated):    Assets:  Communication Skills Desire for Improvement Physical Health Social Support Transportation  ADL's:  Intact  Cognition: WNL  Sleep:     Is the patient at risk to self?  No. Has the patient been a risk to self in the past 6 months?  No. Has the patient been a risk to self within the distant past?  No. Is the patient a risk to others?  No. Has the patient been a risk to others in the past 6 months?  No. Has the patient been a risk to others within the distant past?  No.  Allergies:   Allergies  Allergen Reactions  . Ibuprofen Nausea Only   Current Medications: Current Outpatient Prescriptions  Medication Sig Dispense Refill  . escitalopram (LEXAPRO) 20 MG tablet Take 1 tablet (20 mg total) by mouth daily. 30 tablet 1  . hydrOXYzine (VISTARIL) 25 MG capsule Take 1 capsule (25 mg total) by mouth 2 (two) times daily as needed. 60 capsule 1  . mirtazapine (REMERON) 15 MG tablet Take 1 tablet (15 mg total) by mouth at bedtime. 30 tablet 1   No current facility-administered medications for this visit.    Previous Psychotropic Medications: Lorazepam  Abilify- Sleepy and tired.  No h/o suicide attempt.  No h/o psychiatric illness  FAMILY HISTORY Alcohol- Mother Bipolar- Mother, sister, grandmother OCD- sister, mother, grandmother.     Substance Abuse History in the last 12 months:  Yes.    Uses MJ x 2 years. Denied using alcohol or other illicit drugs.   Consequences of Substance Abuse: Negative NA  Medical Decision Making:  Review of Psycho-Social Stressors (1) and Established Problem, Worsening (2)  Treatment Plan Summary: Medication management    Anxiety Patient will be started on Lexapro 20 mg by mouth daily. He will also be prescribed Vistaril 25 mg by mouth twice a day when necessary for his anxiety symptoms.  Depression He will also be prescribed Remeron 15 mg to help with his depressive symptoms as well as decreased appetite. He agreed with the plan.  Follow-up Patient will follow up in 2 month or earlier depending on his symptoms   More than 50% of the time spent in psychoeducation, counseling and coordination of care.   Time spent with the patient 25 mins     This note was generated in part or whole with voice recognition software. Voice regonition is usually quite accurate but there are transcription errors that can and very often do occur. I apologize for any typographical errors that were not detected and corrected.  Brandy Hale, M.D 11/17/20163:50 PM

## 2015-11-01 ENCOUNTER — Ambulatory Visit: Payer: PRIVATE HEALTH INSURANCE | Admitting: Psychiatry

## 2015-11-08 ENCOUNTER — Encounter: Payer: Self-pay | Admitting: Psychiatry

## 2015-11-08 ENCOUNTER — Ambulatory Visit (INDEPENDENT_AMBULATORY_CARE_PROVIDER_SITE_OTHER): Payer: PRIVATE HEALTH INSURANCE | Admitting: Psychiatry

## 2015-11-08 VITALS — BP 110/68 | HR 95 | Temp 98.1°F | Ht 72.5 in | Wt 177.6 lb

## 2015-11-08 DIAGNOSIS — F4001 Agoraphobia with panic disorder: Secondary | ICD-10-CM

## 2015-11-08 MED ORDER — QUETIAPINE FUMARATE 25 MG PO TABS: 25.0000 mg | ORAL_TABLET | Freq: Every day | ORAL | Status: DC

## 2015-11-08 NOTE — Progress Notes (Signed)
Psychiatric MD/NP Follow up Note   Patient Identification: Lucas Cross MRN:  [id number] Date of Evaluation:  11/08/2015 Referral Source: [facility] Chief Complaint:   Chief Complaint    Follow-up; Medication Refill; Panic Attack; Anxiety     Visit Diagnosis:    ICD-9-CM ICD-10-CM   1. 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 Anxiety [F41.9] 03/22/2015  . Bipolar 2 disorder, major depressive episode (HCC) [F31.81] 03/22/2015  . Clinical depression [F32.9] 03/22/2015  . Cannot sleep [G47.00] 03/22/2015  . Weight loss [R63.4] 03/22/2015   History of Present Illness:    Pt is a 20 yo male  who presented for the follow-up. He reported that he he is doing well in school and has been taking his classes regularly. Patient reported that he has been focused in his schoolwork and will be transferring to [employer]. He reported that he is doing his major in criminal justice. Patient reported that he has stopped all his medications approximately 3 weeks ago as he was trying to control his symptoms without the medications. He has become more religious and has been going to the church and was very calm and relaxed. He will attend church every Wednesday and Sunday. She stated that he is only having problem going to sleep at night and needs medication for the same. He appeared very calm and collective. He reported that he has already stopped using all the drugs including marijuana at this time. He appeared less anxious and was coherent during the interview. He denied having any suicidal homicidal ideations or plans.     Past Medical History:  Past Medical History  Diagnosis Date  . Bipolar disorder (HCC)   . Anxiety     Past Surgical History  Procedure Laterality Date  . Achilles tendon repair  2012   Family History:  Family History  Problem Relation Age of Onset   . Alcohol abuse Mother   . Depression Mother   . Bipolar disorder Mother   . Hypertension Mother   . Depression Sister   . Bipolar disorder Sister   . Obesity Father    Social History:   Social History   Social History  . Marital Status: Single    Spouse Name: N/A  . Number of Children: N/A  . Years of Education: N/A   Social History Main Topics  . Smoking status: Current Every Day Smoker    Types: E-cigarettes    Start date: 08/03/2013  . Smokeless tobacco: Never Used  . Alcohol Use: No  . Drug Use: Yes    Special: Marijuana     Comment: last used 08-30-15  . Sexual Activity: Yes    Birth Control/ Protection: Condom   Other Topics Concern  . None   Social History Narrative   Additional Social History:  Lives with parents. Parents recently divorced. Has a sister.   Musculoskeletal: Strength & Muscle Tone: within normal limits Gait & Station: normal Patient leans: N/A  Psychiatric Specialty Exam: Anxiety      ROS   Blood pressure 110/68, pulse 95, temperature 98.1 F (36.7 C), temperature source Tympanic, height 6' 0.5" (1.842 m), weight 177 lb 9.6 oz (80.559 kg), SpO2 96 %.Body mass index is 23.74 kg/(m^2).  General Appearance: Casual  Eye Contact:  Fair  Speech:  Clear and Coherent and Normal Rate  Volume:  Normal  Mood:  Euthymic  Affect:  Congruent  Thought Process:  Coherent and Goal Directed  Orientation:  Full (Time, Place, and Person)  Thought Content:  WDL  Suicidal Thoughts:  No  Homicidal Thoughts:  No  Memory:  Immediate;   Fair  Judgement:  Fair  Insight:  Fair  Psychomotor Activity:  Normal  Concentration:  Fair  Recall:  [employer] of Knowledge:Fair  Language: Fair  Akathisia:  No  Handed:  Right  AIMS (if indicated):    Assets:  Communication Skills Desire for Improvement Physical Health Social Support Transportation  ADL's:  Intact  Cognition: WNL  Sleep:     Is the patient at risk to self?  No. Has the patient been  a risk to self in the past 6 months?  No. Has the patient been a risk to self within the distant past?  No. Is the patient a risk to others?  No. Has the patient been a risk to others in the past 6 months?  No. Has the patient been a risk to others within the distant past?  No.  Allergies:   Allergies  Allergen Reactions  . Ibuprofen Nausea Only   Current Medications: Current Outpatient Prescriptions  Medication Sig Dispense Refill  . Pseudoephedrine-APAP-DM (TYLENOL COLD/FLU SEVERE DAY PO) Take by mouth.    . escitalopram (LEXAPRO) 20 MG tablet Take 1 tablet (20 mg total) by mouth daily. (Patient not taking: Reported on 11/08/2015) 30 tablet 1  . hydrOXYzine (VISTARIL) 25 MG capsule Take 1 capsule (25 mg total) by mouth 2 (two) times daily as needed. (Patient not taking: Reported on 11/08/2015) 60 capsule 1  . mirtazapine (REMERON) 15 MG tablet Take 1 tablet (15 mg total) by mouth at bedtime. (Patient not taking: Reported on 11/08/2015) 30 tablet 1   No current facility-administered medications for this visit.    Previous Psychotropic Medications: Lorazepam  Abilify- Sleepy and tired.  No h/o suicide attempt.  No h/o psychiatric illness  FAMILY HISTORY Alcohol- Mother Bipolar- Mother, sister, grandmother OCD- sister, mother, grandmother.   Substance Abuse History in the last 12 months:  Yes.    Uses MJ x 2 years. Denied using alcohol or other illicit drugs.   Consequences of Substance Abuse: Negative NA  Medical Decision Making:  Review of Psycho-Social Stressors (1) and Established Problem, Worsening (2)  Treatment Plan Summary: Medication management   I will start him on Seroquel 25 mg by mouth daily at bedtime to help with anxiety and insomnia..  Follow-up Patient will follow up in 2 month or earlier depending on his symptoms   More than 50% of the time spent in psychoeducation, counseling and coordination of care.   Time spent with the patient 25  mins     This note was generated in part or whole with voice recognition software. Voice regonition is usually quite accurate but there are transcription errors that can and very often do occur. I apologize for any typographical errors that were not detected and corrected.     Brandy Hale, M.D 1/24/20173:53 PM

## 2015-11-10 ENCOUNTER — Ambulatory Visit: Payer: PRIVATE HEALTH INSURANCE | Admitting: Psychiatry

## 2015-12-26 ENCOUNTER — Ambulatory Visit (INDEPENDENT_AMBULATORY_CARE_PROVIDER_SITE_OTHER): Payer: 59 | Admitting: Psychiatry

## 2015-12-26 ENCOUNTER — Encounter: Payer: Self-pay | Admitting: Psychiatry

## 2015-12-26 VITALS — BP 138/72 | HR 119 | Temp 98.7°F | Ht 72.5 in | Wt 193.2 lb

## 2015-12-26 DIAGNOSIS — F4001 Agoraphobia with panic disorder: Secondary | ICD-10-CM | POA: Diagnosis not present

## 2015-12-26 MED ORDER — LORAZEPAM 0.5 MG PO TABS: 0.5000 mg | ORAL_TABLET | Freq: Every evening | ORAL | Status: DC | PRN

## 2015-12-26 MED ORDER — PAROXETINE HCL 20 MG PO TABS: 20.0000 mg | ORAL_TABLET | Freq: Every day | ORAL | Status: DC

## 2015-12-26 MED ORDER — QUETIAPINE FUMARATE 25 MG PO TABS: 25.0000 mg | ORAL_TABLET | Freq: Every day | ORAL | Status: DC

## 2015-12-26 NOTE — Progress Notes (Signed)
Psychiatric MD/NP Follow up Note   Patient Identification: Lucas GinsbergChristopher A Cross MRN:  [id number] Date of Evaluation:  12/26/2015 [facility] Chief Complaint:   Chief Complaint    Follow-up; Medication Refill; Anxiety; Panic Attack     Visit Diagnosis:    ICD-9-CM ICD-10-CM   1. 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 Anxiety [F41.9] 03/22/2015  . Bipolar 2 disorder, major depressive episode (HCC) [F31.81] 03/22/2015  . Clinical depression [F32.9] 03/22/2015  . Cannot sleep [G47.00] 03/22/2015  . Weight loss [R63.4] 03/22/2015   History of Present Illness:    Pt is a 20 yo male  who presented for the follow-up. He reported that he continues to have panic and anxiety. He reported that he left his S in the middle as he was feeling very anxious. He failed his psychology test. He reported that he uses father's lorazepam and it made him felt better. Patient reported that he tried Seroquel for 3-4 days and it was actually helping him and he was sleeping better. However he stopped taking the medication as he was looking for some other prescriptions. Patient reported that he has been improving on the Seroquel but he did not continue with the medication. He remained focused on getting a prescription of lorazepam during the interview. He reported that his mother is alcoholic and he has a poor relationship with her. He reported that his family does not want to talk to his mother as she has been using a lot of alcohol and he has been trying to stay away from her as well. He tries to present himself in the coherent manner during the interview. He denied having any suicidal homicidal ideations or plans. He denied having any perceptual disturbances he denied using any use of drugs or alcohol at this time.   Past Medical History:  Past Medical History  Diagnosis Date  .  Bipolar disorder (HCC)   . Anxiety     Past Surgical History  Procedure Laterality Date  . Achilles tendon repair  2012   Family History:  Family History  Problem Relation Age of Onset  . Alcohol abuse Mother   . Depression Mother   . Bipolar disorder Mother   . Hypertension Mother   . Depression Sister   . Bipolar disorder Sister   . Obesity Father    Social History:   Social History   Social History  . Marital Status: Single    Spouse Name: N/A  . Number of Children: N/A  . Years of Education: N/A   Social History Main Topics  . Smoking status: Current Every Day Smoker    Types: E-cigarettes    Start date: 08/03/2013  . Smokeless tobacco: Never Used  . Alcohol Use: No  . Drug Use: Yes    Special: Marijuana     Comment: last used 08-30-15  . Sexual Activity: Yes    Birth Control/ Protection: Condom   Other Topics Concern  . None   Social History Narrative   Additional Social History:  Lives with parents. Parents recently divorced. Has a sister.   Musculoskeletal: Strength & Muscle Tone: within normal limits Gait & Station: normal Patient leans: N/A  Psychiatric Specialty Exam: Anxiety      ROS   Blood pressure 138/72, pulse 119, temperature 98.7 F (37.1 C), temperature source Tympanic, height 6' 0.5" (1.842 m), weight 193  lb 3.2 oz (87.635 kg), SpO2 99 %.Body mass index is 25.83 kg/(m^2).  General Appearance: Casual  Eye Contact:  Fair  Speech:  Clear and Coherent and Normal Rate  Volume:  Normal  Mood:  Euthymic  Affect:  Congruent  Thought Process:  Coherent and Goal Directed  Orientation:  Full (Time, Place, and Person)  Thought Content:  WDL  Suicidal Thoughts:  No  Homicidal Thoughts:  No  Memory:  Immediate;   Fair  Judgement:  Fair  Insight:  Fair  Psychomotor Activity:  Normal  Concentration:  Fair  Recall:  [employer] of Knowledge:Fair  Language: Fair  Akathisia:  No  Handed:  Right  AIMS (if indicated):    Assets:   Communication Skills Desire for Improvement Physical Health Social Support Transportation  ADL's:  Intact  Cognition: WNL  Sleep:     Is the patient at risk to self?  No. Has the patient been a risk to self in the past 6 months?  No. Has the patient been a risk to self within the distant past?  No. Is the patient a risk to others?  No. Has the patient been a risk to others in the past 6 months?  No. Has the patient been a risk to others within the distant past?  No.  Allergies:   Allergies  Allergen Reactions  . Ibuprofen Nausea Only   Current Medications: Current Outpatient Prescriptions  Medication Sig Dispense Refill  . QUEtiapine (SEROQUEL) 25 MG tablet Take 1 tablet (25 mg total) by mouth at bedtime. (Patient not taking: Reported on 12/26/2015) 30 tablet 1   No current facility-administered medications for this visit.    Previous Psychotropic Medications: Lorazepam  Abilify- Sleepy and tired.  No h/o suicide attempt.  No h/o psychiatric illness  FAMILY HISTORY Alcohol- Mother Bipolar- Mother, sister, grandmother OCD- sister, mother, grandmother.   Substance Abuse History in the last 12 months:  Yes.    Uses MJ x 2 years. Denied using alcohol or other illicit drugs.   Consequences of Substance Abuse: Negative NA  Medical Decision Making:  Review of Psycho-Social Stressors (1) and Established Problem, Worsening (2)  Treatment Plan Summary: Medication management    Continue Seroquel 25 mg by mouth daily at bedtime to help with anxiety and insomnia.. I will also start him on Paxil 10 mg twice a day to help with his anxiety and panic attacks. Discussed with him  about the adverse effects and he demonstrated understanding I will also give him a short prescription of lorazepam 0.5 mg #10 pills   when necessary for his anxiety and he agreed with the plan.  Follow-up Patient will follow up in 1 month or earlier depending on his symptoms   More than 50% of the  time spent in psychoeducation, counseling and coordination of care.   Time spent with the patient 25 mins     This note was generated in part or whole with voice recognition software. Voice regonition is usually quite accurate but there are transcription errors that can and very often do occur. I apologize for any typographical errors that were not detected and corrected.     Brandy Hale, M.D 3/13/20172:35 PM

## 2016-01-05 ENCOUNTER — Ambulatory Visit: Payer: PRIVATE HEALTH INSURANCE | Admitting: Psychiatry

## 2016-01-26 ENCOUNTER — Ambulatory Visit: Payer: 59 | Admitting: Psychiatry

## 2016-02-02 ENCOUNTER — Encounter: Payer: Self-pay | Admitting: Psychiatry

## 2016-02-02 ENCOUNTER — Ambulatory Visit (INDEPENDENT_AMBULATORY_CARE_PROVIDER_SITE_OTHER): Payer: 59 | Admitting: Psychiatry

## 2016-02-02 VITALS — BP 118/68 | HR 103 | Temp 98.5°F | Ht 72.5 in | Wt 205.0 lb

## 2016-02-02 DIAGNOSIS — F4001 Agoraphobia with panic disorder: Secondary | ICD-10-CM | POA: Diagnosis not present

## 2016-02-02 MED ORDER — BREXPIPRAZOLE 0.5 MG PO TABS: 0.5000 mg | ORAL_TABLET | Freq: Every day | ORAL | Status: DC

## 2016-02-02 MED ORDER — PAROXETINE HCL 30 MG PO TABS: 30.0000 mg | ORAL_TABLET | Freq: Every day | ORAL | Status: DC

## 2016-02-02 MED ORDER — LORAZEPAM 0.5 MG PO TABS: 0.5000 mg | ORAL_TABLET | Freq: Every evening | ORAL | Status: DC | PRN

## 2016-02-02 NOTE — Progress Notes (Signed)
Psychiatric MD/NP Follow up Note   Patient Identification: Lucas Cross MRN:  [id number] Date of Evaluation:  02/02/2016 Referral Source: [facility] Chief Complaint:   Chief Complaint    Follow-up; Medication Refill; Anxiety; Panic Attack     Visit Diagnosis:    ICD-9-CM ICD-10-CM   1. 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 Anxiety [F41.9] 03/22/2015  . Bipolar 2 disorder, major depressive episode (HCC) [F31.81] 03/22/2015  . Clinical depression [F32.9] 03/22/2015  . Cannot sleep [G47.00] 03/22/2015  . Weight loss [R63.4] 03/22/2015   History of Present Illness:    Pt is a 20 yo male  who presented for the follow-up. He reported that he continues to have panic and anxiety. He reported that he Has started taking his medications as prescribed. He reported improvement in his  symptoms since she was started on the Paxil. He reported that his anxiety is  improving. Patient reported that he has noticed that he has some anger especially when he is working and he feels that people are talking about him. He reported that he has history of anger and anxiety in the past. He reported that he was feeling very sleepy and tired on Seroquel and the wants to have his medications adjusted. He currently denied having any suicidal ideations or plans. He reported that he took lorazepam for couple of days and it was not helping him. He only takes it as needed.  He currently denied using any drugs or alcohol. He appeared anxious during the interview as usual. However he reported that Paxil is helping him and he is interested in titrating the dose of the medication.  Past Medical History:  Past Medical History  Diagnosis Date  . Bipolar disorder (HCC)   . Anxiety     Past Surgical History  Procedure Laterality Date  . Achilles tendon repair  2012   Family History:  Family  History  Problem Relation Age of Onset  . Alcohol abuse Mother   . Depression Mother   . Bipolar disorder Mother   . Hypertension Mother   . Depression Sister   . Bipolar disorder Sister   . Obesity Father    Social History:   Social History   Social History  . Marital Status: Single    Spouse Name: N/A  . Number of Children: N/A  . Years of Education: N/A   Social History Main Topics  . Smoking status: Current Every Day Smoker    Types: E-cigarettes    Start date: 08/03/2013  . Smokeless tobacco: Never Used  . Alcohol Use: No  . Drug Use: Yes    Special: Marijuana     Comment: last used 08-30-15  . Sexual Activity: Yes    Birth Control/ Protection: Condom   Other Topics Concern  . None   Social History Narrative   Additional Social History:  Lives with parents. Parents recently divorced. Has a sister.   Musculoskeletal: Strength & Muscle Tone: within normal limits Gait & Station: normal Patient leans: N/A  Psychiatric Specialty Exam: Anxiety      ROS  Blood pressure 118/68, pulse 103, temperature 98.5 F (36.9 C), temperature source Tympanic, height 6' 0.5" (1.842 m), weight 205 lb (92.987 kg), SpO2 98 %.Body mass index is 27.41 kg/(m^2).  General Appearance: Casual  Eye Contact:  Fair  Speech:  Clear and Coherent and Normal Rate  Volume:  Normal  Mood:  Euthymic  Affect:  Congruent  Thought Process:  Coherent and Goal Directed  Orientation:  Full (Time, Place, and Person)  Thought Content:  WDL  Suicidal Thoughts:  No  Homicidal Thoughts:  No  Memory:  Immediate;   Fair  Judgement:  Fair  Insight:  Fair  Psychomotor Activity:  Normal  Concentration:  Fair  Recall:  [employer]  Fund of Knowledge:Fair  Language: Fair  Akathisia:  No  Handed:  Right  AIMS (if indicated):    Assets:  Communication Skills Desire for Improvement Physical Health Social Support Transportation  ADL's:  Intact  Cognition: WNL  Sleep:     Is the patient at risk to  self?  No. Has the patient been a risk to self in the past 6 months?  No. Has the patient been a risk to self within the distant past?  No. Is the patient a risk to others?  No. Has the patient been a risk to others in the past 6 months?  No. Has the patient been a risk to others within the distant past?  No.  Allergies:   Allergies  Allergen Reactions  . Ibuprofen Nausea Only   Current Medications: Current Outpatient Prescriptions  Medication Sig Dispense Refill  . LORazepam (ATIVAN) 0.5 MG tablet Take 1 tablet (0.5 mg total) by mouth at bedtime as needed for anxiety. 10 tablet 0  . PARoxetine (PAXIL) 20 MG tablet Take 1 tablet (20 mg total) by mouth daily. 1/2 pill twice daily 30 tablet 1  . QUEtiapine (SEROQUEL) 25 MG tablet Take 1 tablet (25 mg total) by mouth at bedtime. 30 tablet 1   No current facility-administered medications for this visit.    Previous Psychotropic Medications: Lorazepam  Abilify- Sleepy and tired.  No h/o suicide attempt.  No h/o psychiatric illness  FAMILY HISTORY Alcohol- Mother Bipolar- Mother, sister, grandmother OCD- sister, mother, grandmother.   Substance Abuse History in the last 12 months:  Yes.    Uses MJ x 2 years. Denied using alcohol or other illicit drugs.   Consequences of Substance Abuse: Negative NA  Medical Decision Making:  Review of Psycho-Social Stressors (1) and Established Problem, Worsening (2)  Treatment Plan Summary: Medication management    Discontinue Seroquel I will also start him on Paxil 15 mg twice a day to help with his anxiety and panic attacks. Discussed with him  about the adverse effects and he demonstrated understanding I will also give him a short prescription of lorazepam 0.5 mg #10 pills   when necessary for his anxiety and he agreed with the plan. I will start him on Rexulti 0.5 mg daily to control with his depression and anger and he demonstrated understanding He will be given samples for the next 2  weeks  Follow-up Patient will follow up in 2 weeks  or earlier depending on his symptoms   More than 50% of the time spent in psychoeducation, counseling and coordination of care.   Time spent with the patient 25 mins     This note was generated in part or whole with voice recognition software. Voice regonition is usually quite accurate but there are transcription errors that can and very often do occur. I apologize for any typographical errors that were not detected and corrected.     Brandy HaleUzma Jeptha Hinnenkamp, M.D 4/20/20173:54 PM

## 2016-02-16 ENCOUNTER — Ambulatory Visit (INDEPENDENT_AMBULATORY_CARE_PROVIDER_SITE_OTHER): Payer: 59 | Admitting: Psychiatry

## 2016-02-16 ENCOUNTER — Other Ambulatory Visit: Payer: Self-pay | Admitting: Psychiatry

## 2016-02-16 ENCOUNTER — Encounter: Payer: Self-pay | Admitting: Psychiatry

## 2016-02-16 VITALS — BP 120/72 | HR 120 | Temp 98.8°F | Ht 72.5 in | Wt 209.4 lb

## 2016-02-16 DIAGNOSIS — F4001 Agoraphobia with panic disorder: Secondary | ICD-10-CM

## 2016-02-16 MED ORDER — BREXPIPRAZOLE 0.5 MG PO TABS: 1.0000 mg | ORAL_TABLET | Freq: Every day | ORAL | Status: DC

## 2016-02-16 MED ORDER — PAROXETINE HCL 20 MG PO TABS: 20.0000 mg | ORAL_TABLET | Freq: Every day | ORAL | Status: AC

## 2016-02-16 MED ORDER — BREXPIPRAZOLE 1 MG PO TABS: 1.0000 mg | ORAL_TABLET | Freq: Every morning | ORAL | Status: AC

## 2016-02-16 MED ORDER — CLONAZEPAM 0.5 MG PO TABS
0.5000 mg | ORAL_TABLET | Freq: Two times a day (BID) | ORAL | Status: AC | PRN
Start: 2016-02-16 — End: ?
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
## 2016-03-22 ENCOUNTER — Ambulatory Visit (INDEPENDENT_AMBULATORY_CARE_PROVIDER_SITE_OTHER): Payer: Self-pay | Admitting: Psychiatry

## 2016-07-21 IMAGING — CT CT ABD-PELV W/ CM
2 of 4 series · 16 of 46 positions shown, 18 images · IV contrast (agent unspecified)
Comparison: None.

CLINICAL DATA: 18-year-old male with right lower quadrant pain
beginning last night. Vomiting. Initial encounter.

EXAM:
CT ABDOMEN AND PELVIS WITH CONTRAST
TECHNIQUE: Multidetector CT imaging of the abdomen and pelvis was performed
using the standard protocol following bolus administration of
intravenous contrast.
CONTRAST:  100 mL Psovue-7PP.

[Series 4: routine abd pel with-- · axial · 0.67mm/px · z∈[-1172,-727]mm · 13 of 97 slices shown, 15 images]
[im 4/97  soft-tissue]
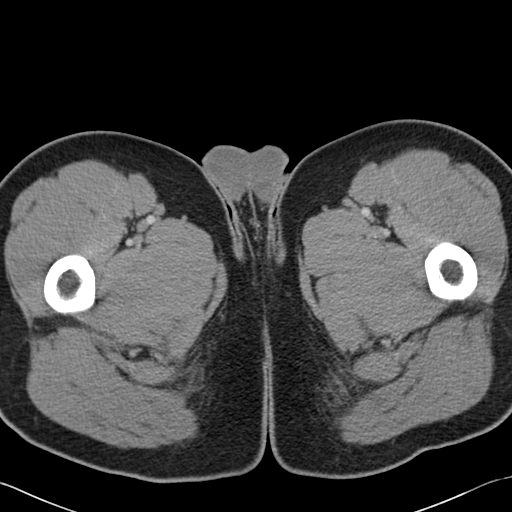
[im 4/97  bone]
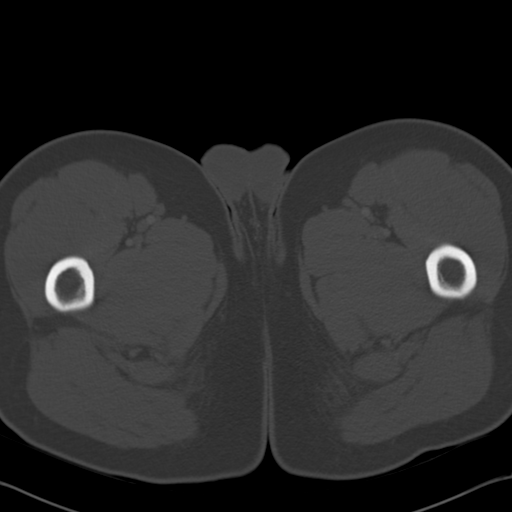
[im 12/97  soft-tissue]
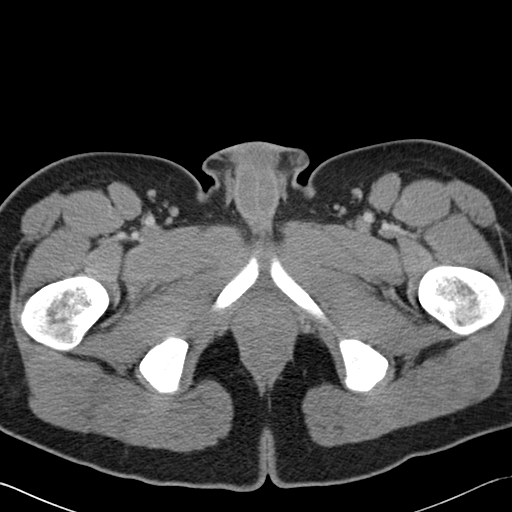
[im 20/97  soft-tissue]
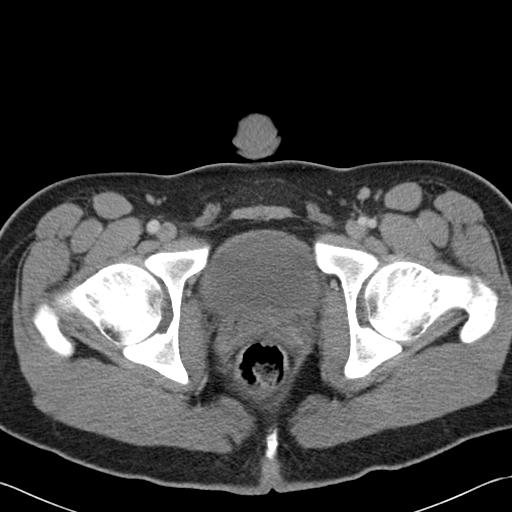
[im 27/97  soft-tissue]
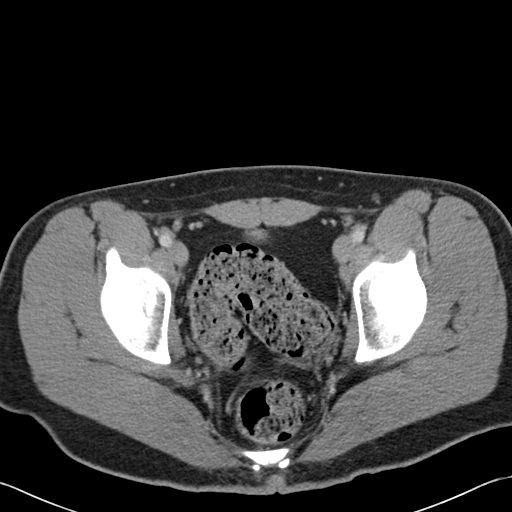
[im 35/97  soft-tissue]
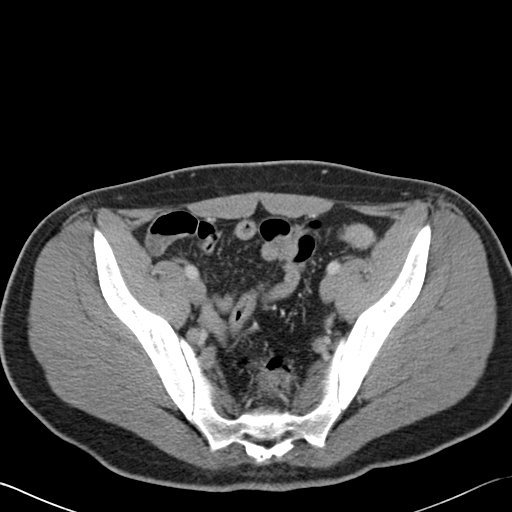
[im 43/97  soft-tissue]
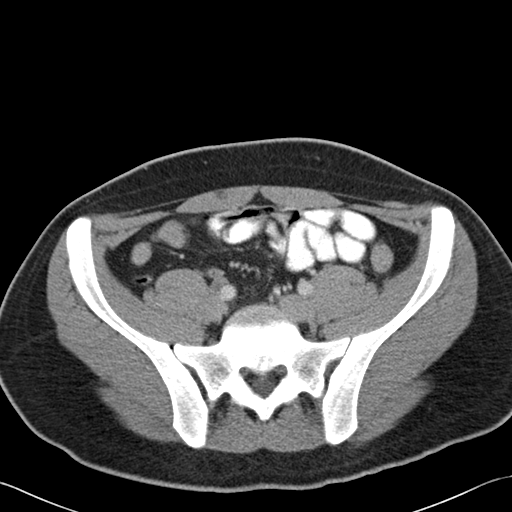
[im 50/97  soft-tissue]
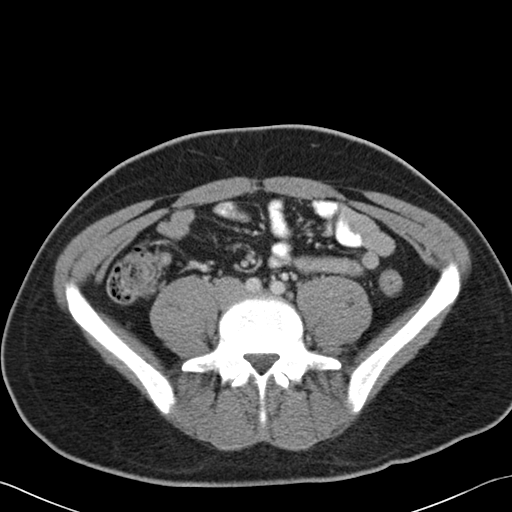
[im 54/97  soft-tissue]
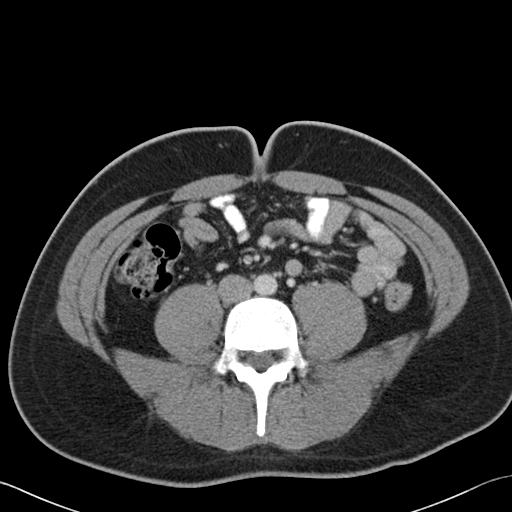
[im 62/97  soft-tissue]
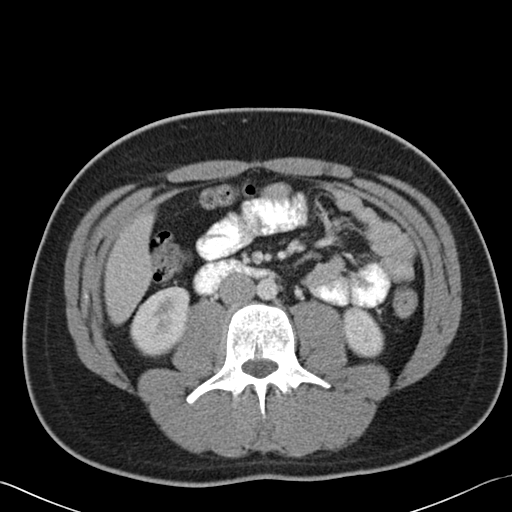
[im 62/97  bone]
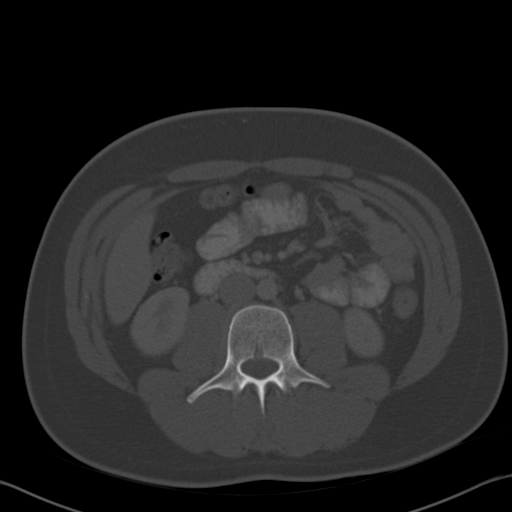
[im 70/97  soft-tissue]
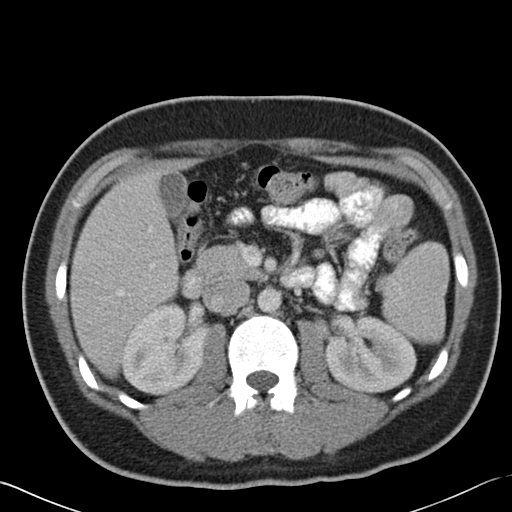
[im 77/97  soft-tissue]
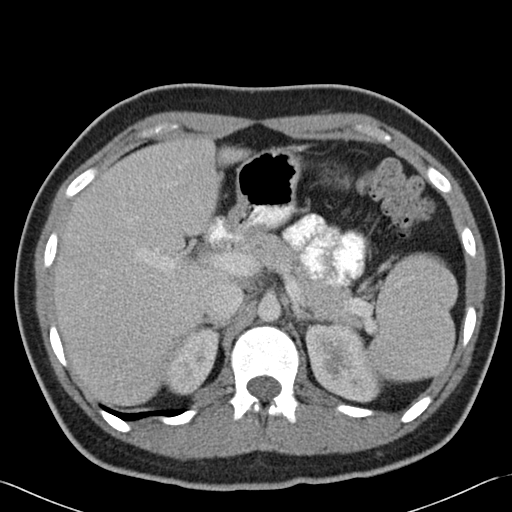
[im 85/97  soft-tissue]
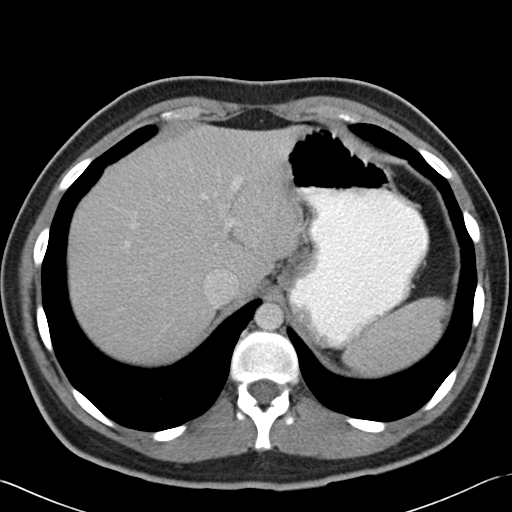
[im 93/97  soft-tissue]
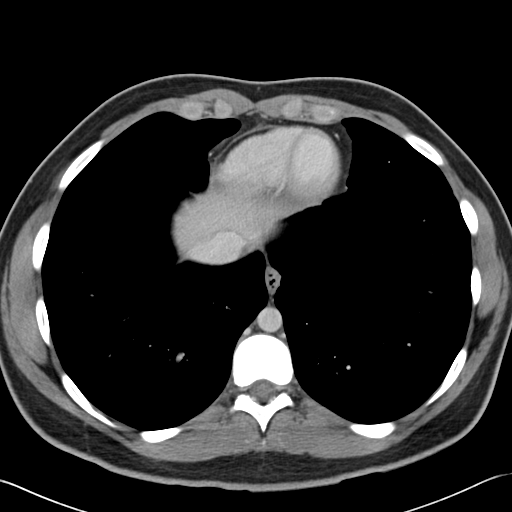

[Series 6: cor routine abd pel with · coronal · 0.69mm/px · 3 of 133 slices shown]
[im 45/133  soft-tissue]
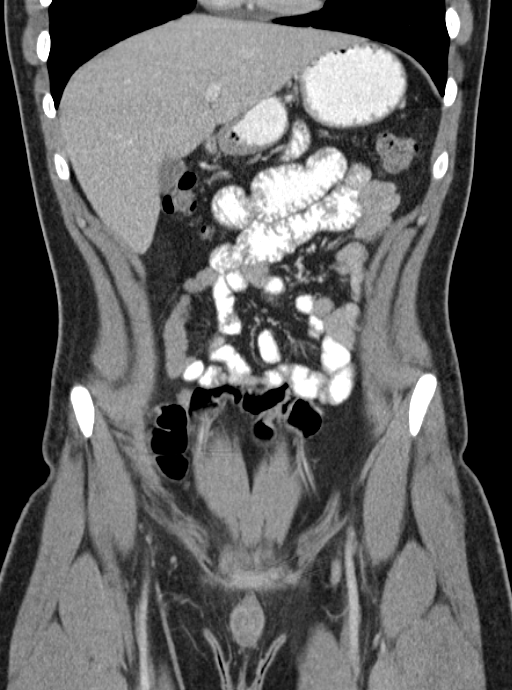
[im 59/133  soft-tissue]
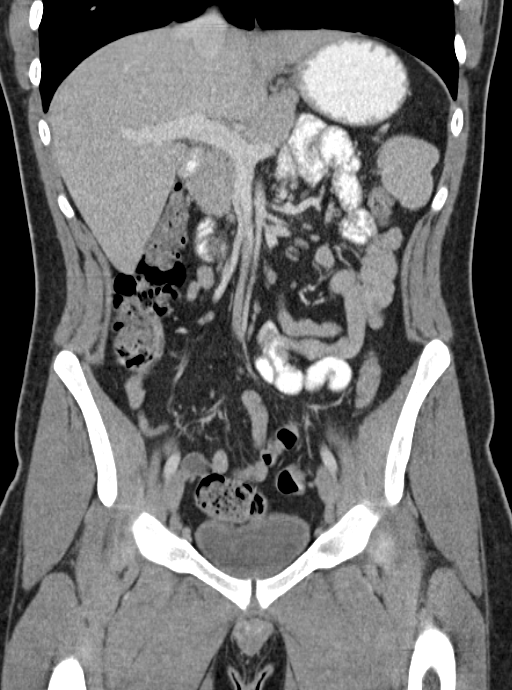
[im 74/133  soft-tissue]
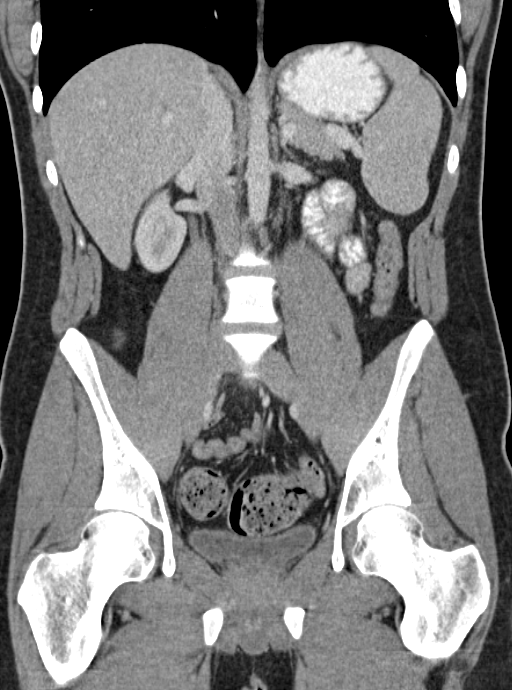

[16 of 46 positions shown; findings below may reference images not displayed]

FINDINGS: Negative lung bases.  No pericardial or pleural effusion.

No acute osseous abnormality identified.

No pelvic free fluid. Diminutive bladder. Redundant distal colon
with retained stool but no wall thickening.

Decompressed left colon. Negative transverse colon. Negative right
colon. Normal appendix (series 4, image 55). No dilated small bowel.
Oral contrast has not yet reached the terminal ileum. Negative TI
and no inflamed small bowel loops identified. Stomach and duodenum
within normal limits.

Liver, gallbladder, spleen, pancreas and adrenal glands are within
normal limits. Portal venous system is patent. Major arterial
structures in the abdomen and pelvis are normal. No abdominal free
fluid. Normal kidneys. Normal course of the right ureter. No
lymphadenopathy identified.
IMPRESSION: Normal appendix. Normal CT Abdomen and Pelvis.
